# Patient Record
Sex: Female | Born: 1975 | Race: Black or African American | Hispanic: No | Marital: Single | Smoking: Never smoker
Health system: Southern US, Community
[De-identification: ages and names within clinical notes are randomized; demographics above are authoritative.]

---

## 2000-07-21 ENCOUNTER — Emergency Department (HOSPITAL_COMMUNITY): Admission: EM | Admit: 2000-07-21 | Discharge: 2000-07-21 | Payer: Self-pay | Admitting: Emergency Medicine

## 2000-09-16 ENCOUNTER — Emergency Department (HOSPITAL_COMMUNITY): Admission: EM | Admit: 2000-09-16 | Discharge: 2000-09-16 | Payer: Self-pay | Admitting: Emergency Medicine

## 2001-07-24 ENCOUNTER — Emergency Department (HOSPITAL_COMMUNITY): Admission: EM | Admit: 2001-07-24 | Discharge: 2001-07-24 | Payer: Self-pay

## 2002-01-26 ENCOUNTER — Emergency Department (HOSPITAL_COMMUNITY): Admission: EM | Admit: 2002-01-26 | Discharge: 2002-01-26 | Payer: Self-pay | Admitting: Emergency Medicine

## 2002-05-20 ENCOUNTER — Emergency Department (HOSPITAL_COMMUNITY): Admission: EM | Admit: 2002-05-20 | Discharge: 2002-05-20 | Payer: Self-pay | Admitting: Emergency Medicine

## 2003-11-18 ENCOUNTER — Emergency Department (HOSPITAL_COMMUNITY): Admission: EM | Admit: 2003-11-18 | Discharge: 2003-11-18 | Payer: Self-pay | Admitting: Emergency Medicine

## 2004-01-27 ENCOUNTER — Emergency Department (HOSPITAL_COMMUNITY): Admission: EM | Admit: 2004-01-27 | Discharge: 2004-01-27 | Payer: Self-pay | Admitting: Emergency Medicine

## 2004-02-18 ENCOUNTER — Emergency Department (HOSPITAL_COMMUNITY): Admission: EM | Admit: 2004-02-18 | Discharge: 2004-02-18 | Payer: Self-pay | Admitting: Emergency Medicine

## 2004-11-15 ENCOUNTER — Emergency Department (HOSPITAL_COMMUNITY): Admission: EM | Admit: 2004-11-15 | Discharge: 2004-11-15 | Payer: Self-pay | Admitting: Emergency Medicine

## 2004-12-11 ENCOUNTER — Ambulatory Visit (HOSPITAL_COMMUNITY): Admission: RE | Admit: 2004-12-11 | Discharge: 2004-12-11 | Payer: Self-pay | Admitting: *Deleted

## 2005-02-05 ENCOUNTER — Ambulatory Visit (HOSPITAL_COMMUNITY): Admission: RE | Admit: 2005-02-05 | Discharge: 2005-02-05 | Payer: Self-pay | Admitting: *Deleted

## 2005-03-06 ENCOUNTER — Inpatient Hospital Stay (HOSPITAL_COMMUNITY): Admission: AD | Admit: 2005-03-06 | Discharge: 2005-03-06 | Payer: Self-pay | Admitting: *Deleted

## 2005-04-07 ENCOUNTER — Ambulatory Visit (HOSPITAL_COMMUNITY): Admission: RE | Admit: 2005-04-07 | Discharge: 2005-04-07 | Payer: Self-pay | Admitting: *Deleted

## 2005-05-29 ENCOUNTER — Ambulatory Visit (HOSPITAL_COMMUNITY): Admission: RE | Admit: 2005-05-29 | Discharge: 2005-05-29 | Payer: Self-pay | Admitting: Obstetrics

## 2005-07-03 ENCOUNTER — Inpatient Hospital Stay (HOSPITAL_COMMUNITY): Admission: AD | Admit: 2005-07-03 | Discharge: 2005-07-05 | Payer: Self-pay | Admitting: Obstetrics

## 2005-10-22 ENCOUNTER — Emergency Department (HOSPITAL_COMMUNITY): Admission: EM | Admit: 2005-10-22 | Discharge: 2005-10-22 | Payer: Self-pay | Admitting: Emergency Medicine

## 2005-11-11 ENCOUNTER — Encounter: Admission: RE | Admit: 2005-11-11 | Discharge: 2005-11-24 | Payer: Self-pay | Admitting: Specialist

## 2006-01-25 ENCOUNTER — Emergency Department (HOSPITAL_COMMUNITY): Admission: EM | Admit: 2006-01-25 | Discharge: 2006-01-25 | Payer: Self-pay | Admitting: Emergency Medicine

## 2006-04-14 IMAGING — US US OB LIMITED
1 series · 14 of 28 positions shown · non-contrast
Comparison: none

CLINICAL DATA: Low-lying placenta noted on prior exam.  Assess placental location.

[Series 1: us ob limited · 0.29mm/px · 14 of 39 slices shown]
[im 2/39]
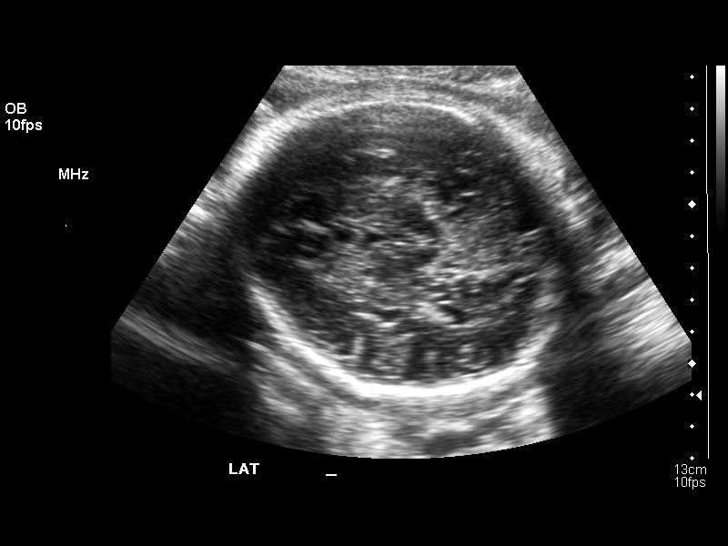
[im 5/39]
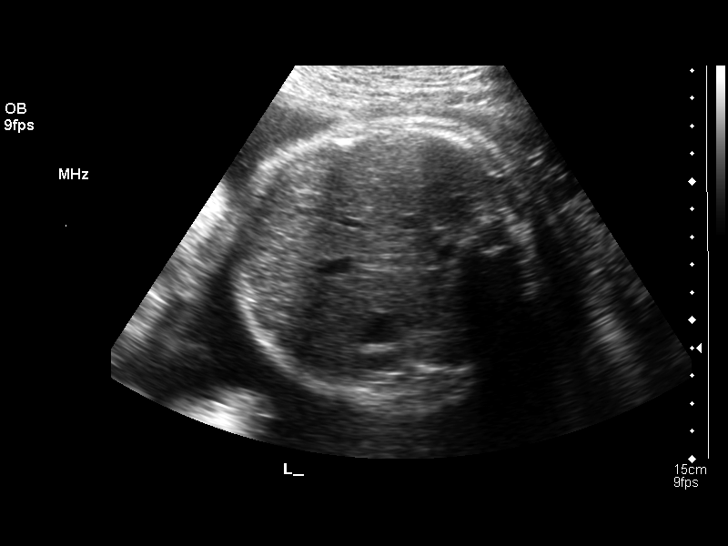
[im 8/39]
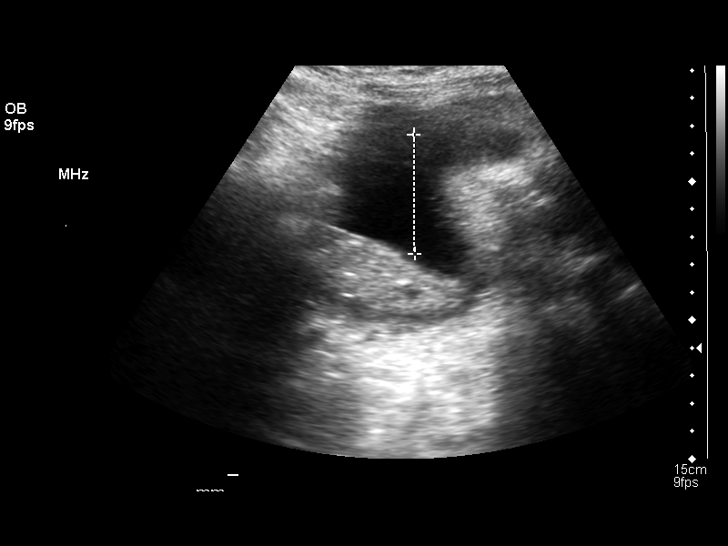
[im 10/39]
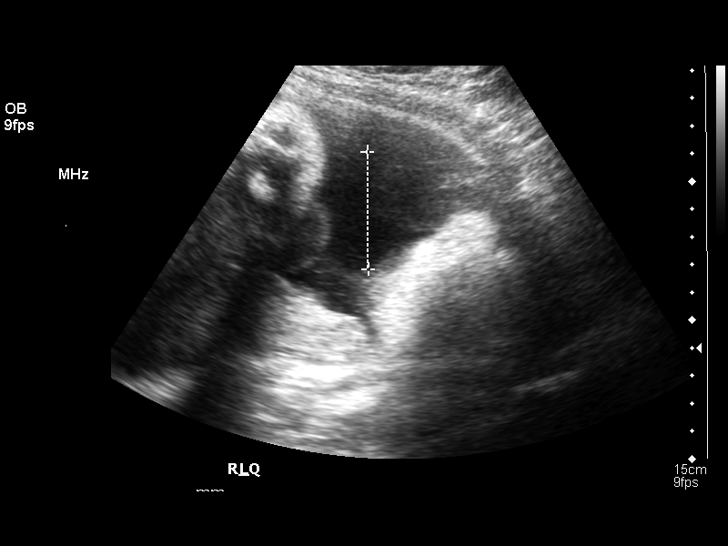
[im 13/39]
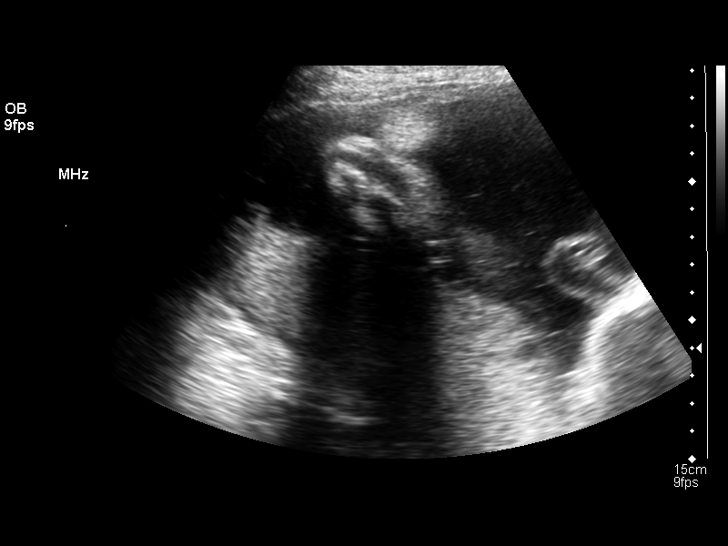
[im 16/39]
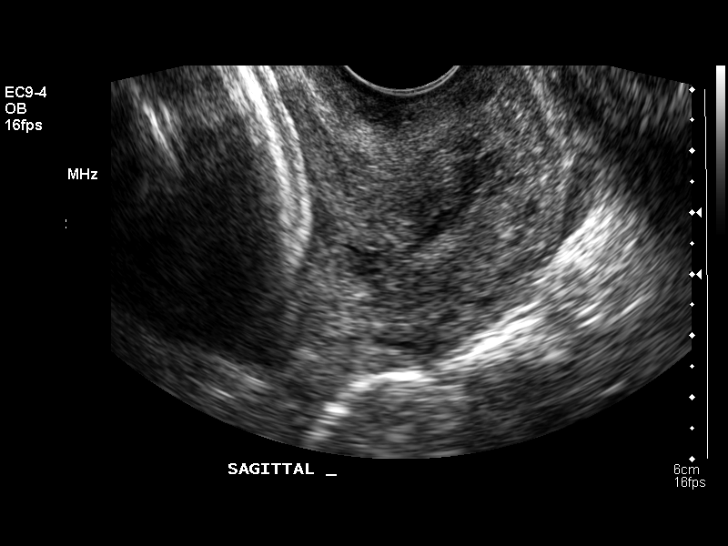
[im 19/39]
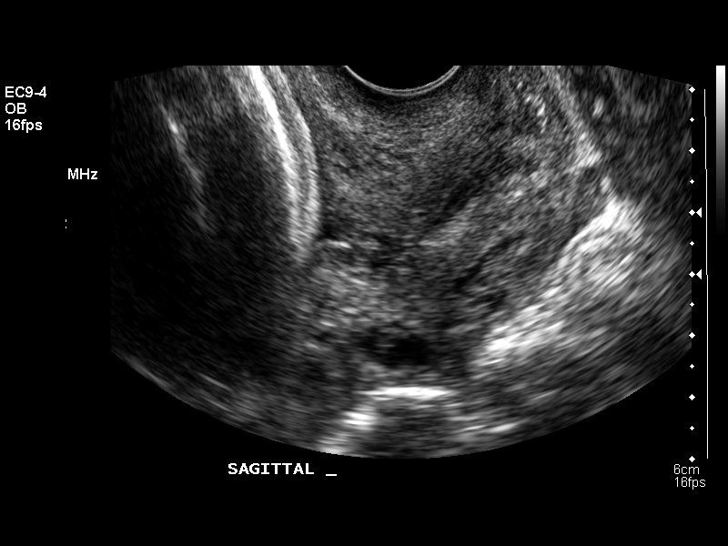
[im 22/39]
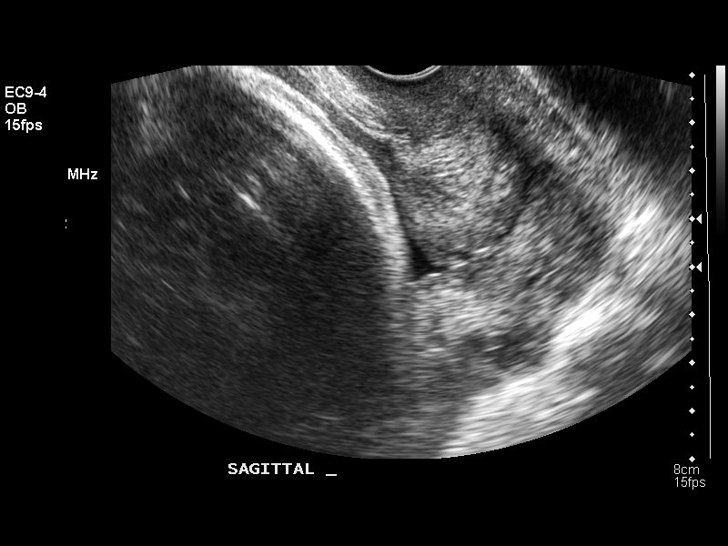
[im 24/39]
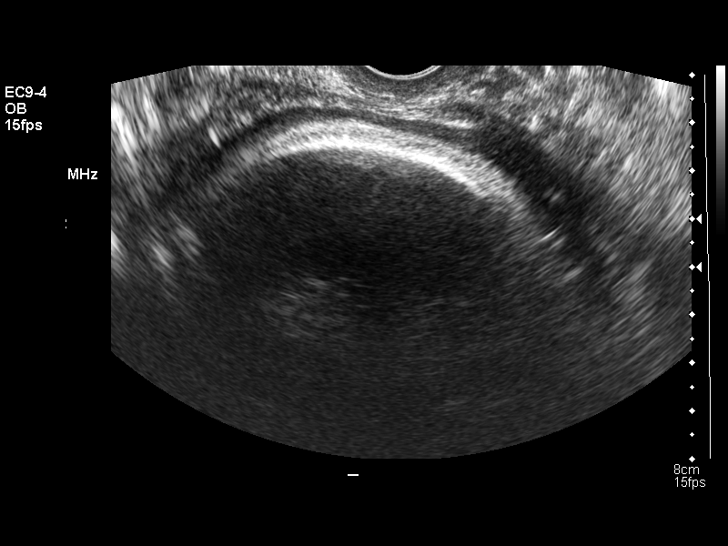
[im 27/39]
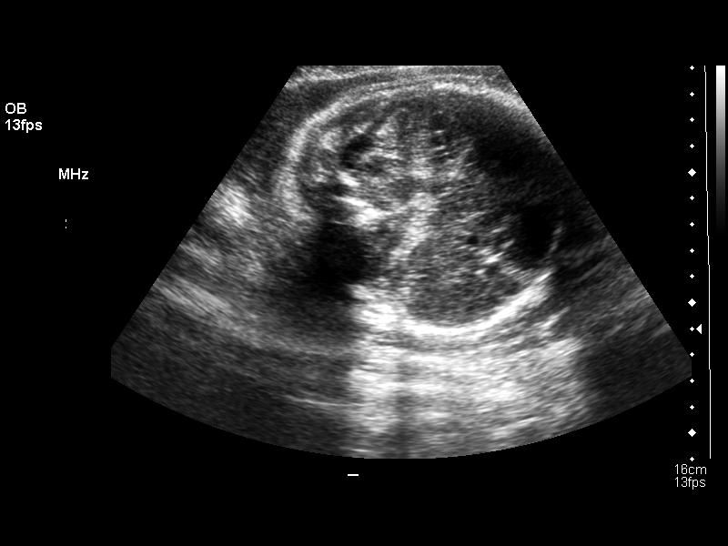
[im 30/39]
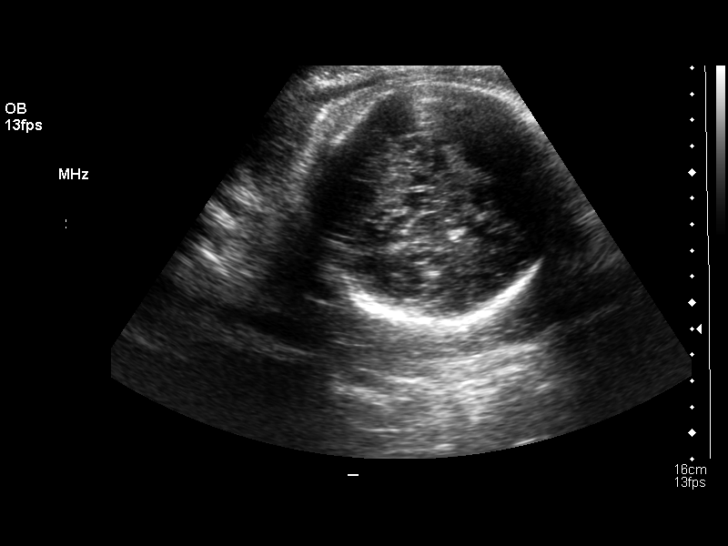
[im 33/39]
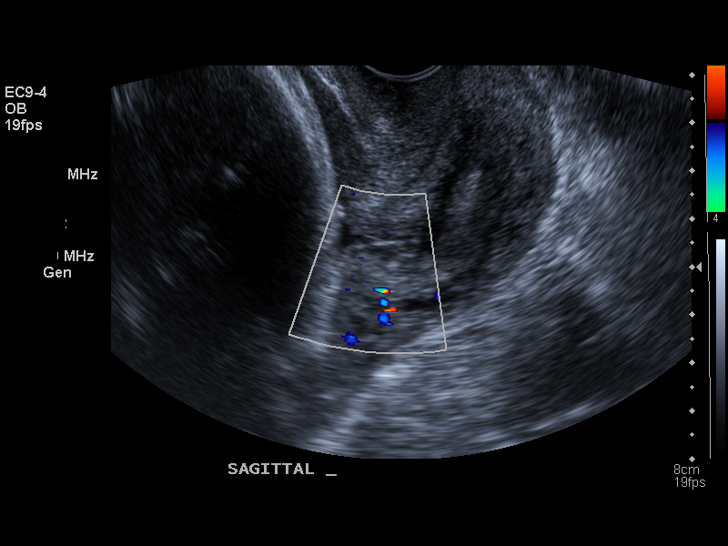
[im 36/39]
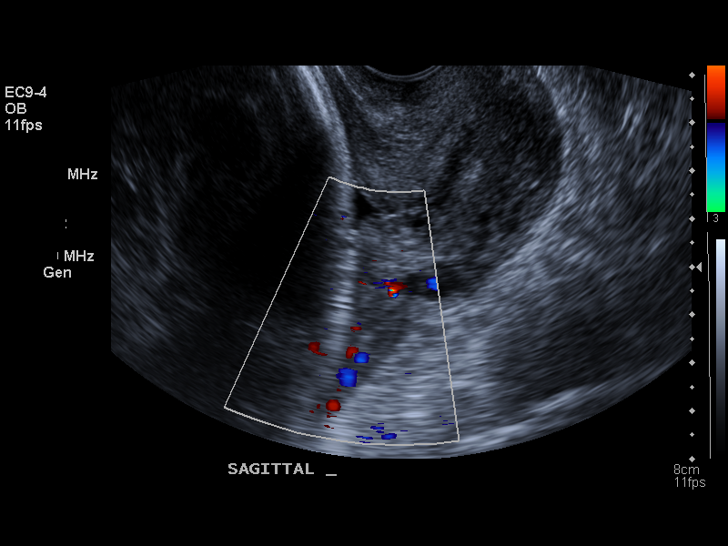
[im 39/39]
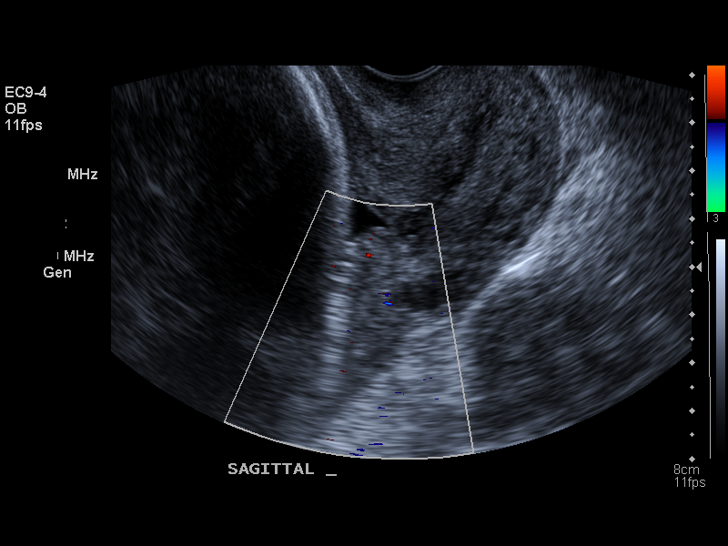

[14 of 28 positions shown; findings below may reference images not displayed]

LIMITED OBSTETRICAL ULTRASOUND WITH TRANSVAGINAL:
Number of Fetuses:  1
Heart Rate:  157
Movement:  Yes
Breathing:  Yes
Presentation:  Cephalic
Placental Location:  Posterior, right lateral 
Grade:  I
Previa:  No
Amniotic Fluid (Subjective):  Normal
Amniotic Fluid (Objective):  15.1 cm AFI (5th -95th%ile = 7.9 -  24.9 cm for 35 wks)

Fetal measurements and complete anatomic evaluation were not requested.  The following fetal anatomy was visualized during this exam:  Lateral ventricles, four chamber heart, stomach, and kidneys.  

MATERNAL UTERINE AND ADNEXAL FINDINGS
Cervix:  3.2 cm Transvaginally
IMPRESSION: 1.  No evidence for residual low-lying placenta.
2.  Subjectively and quantitatively normal amniotic fluid volume and normal cervical length.
3.  No late developing fetal anatomic abnormalities are identified associated with the lateral ventricles, four chamber heart, stomach, kidneys or bladder.

## 2011-12-15 ENCOUNTER — Emergency Department (HOSPITAL_COMMUNITY)
Admission: EM | Admit: 2011-12-15 | Discharge: 2011-12-15 | Disposition: A | Discharge status: Home / Self Care | Payer: Self-pay | Attending: Emergency Medicine | Admitting: Emergency Medicine

## 2011-12-15 ENCOUNTER — Encounter: Payer: Self-pay | Admitting: *Deleted

## 2011-12-15 ENCOUNTER — Emergency Department (HOSPITAL_COMMUNITY): Payer: Self-pay

## 2011-12-15 DIAGNOSIS — R10819 Abdominal tenderness, unspecified site: Secondary | ICD-10-CM | POA: Insufficient documentation

## 2011-12-15 DIAGNOSIS — K5289 Other specified noninfective gastroenteritis and colitis: Secondary | ICD-10-CM | POA: Insufficient documentation

## 2011-12-15 DIAGNOSIS — K529 Noninfective gastroenteritis and colitis, unspecified: Secondary | ICD-10-CM

## 2011-12-15 DIAGNOSIS — R109 Unspecified abdominal pain: Secondary | ICD-10-CM | POA: Insufficient documentation

## 2011-12-15 DIAGNOSIS — R319 Hematuria, unspecified: Secondary | ICD-10-CM | POA: Insufficient documentation

## 2011-12-15 LAB — URINALYSIS, ROUTINE W REFLEX MICROSCOPIC
Bilirubin Urine: NEGATIVE
Glucose, UA: NEGATIVE mg/dL
Ketones, ur: NEGATIVE mg/dL
Leukocytes, UA: NEGATIVE
Nitrite: NEGATIVE
Protein, ur: NEGATIVE mg/dL
Specific Gravity, Urine: 1.01 (ref 1.005–1.030)
Urobilinogen, UA: 0.2 mg/dL (ref 0.0–1.0)

## 2011-12-15 LAB — COMPREHENSIVE METABOLIC PANEL
ALT: 32 U/L (ref 0–35)
Albumin: 4.4 g/dL (ref 3.5–5.2)
Alkaline Phosphatase: 82 U/L (ref 39–117)
BUN: 12 mg/dL (ref 6–23)
CO2: 23 mEq/L (ref 19–32)
Calcium: 10.7 mg/dL — ABNORMAL HIGH (ref 8.4–10.5)
GFR calc Af Amer: >90 mL/min (ref >90)
Glucose, Bld: 167 mg/dL — ABNORMAL HIGH (ref 70–99)
Potassium: 3.4 mEq/L — ABNORMAL LOW (ref 3.5–5.1)
Sodium: 138 mEq/L (ref 135–145)
Total Protein: 8 g/dL (ref 6.0–8.3)

## 2011-12-15 LAB — DIFFERENTIAL
Basophils Absolute: 0 10*3/uL (ref 0.0–0.1)
Eosinophils Absolute: 0 10*3/uL (ref 0.0–0.7)
Eosinophils Relative: 0 % (ref 0–5)
Lymphs Abs: 1 10*3/uL (ref 0.7–4.0)
Monocytes Relative: 5 % (ref 3–12)
Neutro Abs: 14 10*3/uL — ABNORMAL HIGH (ref 1.7–7.7)
Neutrophils Relative %: 89 % — ABNORMAL HIGH (ref 43–77)

## 2011-12-15 LAB — CBC
HCT: 38.2 % (ref 36.0–46.0)
Hemoglobin: 13.7 g/dL (ref 12.0–15.0)
MCH: 31.8 pg (ref 26.0–34.0)
MCHC: 35.9 g/dL (ref 30.0–36.0)
MCV: 88.6 fL (ref 78.0–100.0)
Platelets: 324 10*3/uL (ref 150–400)
RBC: 4.31 MIL/uL (ref 3.87–5.11)
RDW: 13.2 % (ref 11.5–15.5)

## 2011-12-15 LAB — URINE MICROSCOPIC-ADD ON

## 2011-12-15 LAB — LIPASE, BLOOD: Lipase: 21 U/L (ref 11–59)

## 2011-12-15 LAB — PREGNANCY, URINE: Preg Test, Ur: NEGATIVE

## 2011-12-15 MED ORDER — ONDANSETRON HCL 4 MG PO TABS: 4.0000 mg | ORAL_TABLET | Freq: Four times a day (QID) | ORAL | Status: AC

## 2011-12-15 MED ORDER — MORPHINE SULFATE 4 MG/ML IJ SOLN: 6.0000 mg | Freq: Once | INTRAMUSCULAR | Status: AC

## 2011-12-15 MED ORDER — DICYCLOMINE HCL 20 MG PO TABS: 20.0000 mg | ORAL_TABLET | Freq: Two times a day (BID) | ORAL | Status: AC

## 2011-12-15 MED ORDER — SODIUM CHLORIDE 0.9 % IV BOLUS (SEPSIS)
1000.0000 mL | Freq: Once | INTRAVENOUS | Status: AC
  Administered 2011-12-15: 1000 mL via INTRAVENOUS

## 2011-12-15 MED ORDER — MORPHINE SULFATE 2 MG/ML IJ SOLN
INTRAMUSCULAR | Status: AC
  Administered 2011-12-15: 6 mg via INTRAVENOUS
  Filled 2011-12-15: qty 3

## 2011-12-15 MED ORDER — ONDANSETRON HCL 4 MG/2ML IJ SOLN
4.0000 mg | Freq: Once | INTRAMUSCULAR | Status: AC
  Administered 2011-12-15: 4 mg via INTRAVENOUS
  Filled 2011-12-15: qty 2

## 2011-12-15 NOTE — ED Notes (Signed)
Transported to CT 

## 2011-12-15 NOTE — ED Notes (Signed)
Returned from CT.

## 2011-12-15 NOTE — ED Provider Notes (Signed)
History     CSN: [id number] Arrival date & time: 12/15/2011 11:43 AM   First MD Initiated Contact with Patient 12/15/11 1224      Chief Complaint  Patient presents with  . Abdominal Pain    pt reports abd pain that began last night. pt began to projectile vomiting while in triage.     (Consider location/radiation/quality/duration/timing/severity/associated sxs/prior treatment) Patient is a 35 y.o. female presenting with abdominal pain. The history is provided by the patient.  Abdominal Pain The primary symptoms of the illness include abdominal pain, nausea, vomiting and diarrhea. The primary symptoms of the illness do not include fever, fatigue or shortness of breath. The current episode started yesterday. The onset of the illness was gradual. The problem has been gradually worsening.  The patient states that she believes she is currently not pregnant. The patient has had a change in bowel habit (diarrhea). Symptoms associated with the illness do not include chills, diaphoresis, urgency, hematuria, frequency or back pain. Significant associated medical issues do not include gallstones or diverticulitis.    History reviewed. No pertinent past medical history.  History reviewed. No pertinent past surgical history.  History reviewed. No pertinent family history.  History  Substance Use Topics  . Smoking status: Never Smoker   . Smokeless tobacco: Not on file  . Alcohol Use: No    OB History    Grav Para Term Preterm Abortions TAB SAB Ect Mult Living                  Review of Systems  Constitutional: Negative for fever, chills, diaphoresis and fatigue.  HENT: Negative for congestion, rhinorrhea and sneezing.   Eyes: Negative.   Respiratory: Negative for cough, chest tightness and shortness of breath.   Cardiovascular: Negative for chest pain and leg swelling.  Gastrointestinal: Positive for nausea, vomiting, abdominal pain and diarrhea. Negative for blood in stool.    Genitourinary: Negative for urgency, frequency, hematuria, flank pain and difficulty urinating.  Musculoskeletal: Negative for back pain and arthralgias.  Skin: Negative for rash.  Neurological: Negative for dizziness, speech difficulty, weakness, numbness and headaches.    Allergies  Review of patient's allergies indicates no known allergies.  Home Medications   Current Outpatient Rx  Name Route Sig Dispense Refill  . DICYCLOMINE HCL 20 MG PO TABS Oral Take 1 tablet (20 mg total) by mouth 2 (two) times daily. 20 tablet 0  . ONDANSETRON HCL 4 MG PO TABS Oral Take 1 tablet (4 mg total) by mouth every 6 (six) hours. 12 tablet 0    BP 146/117  Pulse 83  Temp(Src) 97.1 F (36.2 C) (Oral)  Resp 20  Ht 5\' 6"  (1.676 m)  Wt 200 lb (90.719 kg)  BMI 32.28 kg/m2  SpO2 100%  LMP 12/07/2011  Physical Exam  Constitutional: She is oriented to person, place, and time. She appears well-developed and well-nourished.  HENT:  Head: Normocephalic and atraumatic.  Eyes: Pupils are equal, round, and reactive to light.  Neck: Normal range of motion. Neck supple.  Cardiovascular: Normal rate, regular rhythm and normal heart sounds.   Pulmonary/Chest: Effort normal and breath sounds normal. No respiratory distress. She has no wheezes. She has no rales. She exhibits no tenderness.  Abdominal: Soft. Bowel sounds are normal. There is tenderness. There is no rebound and no guarding.       Moderate diffuse tenderness  Musculoskeletal: Normal range of motion. She exhibits no edema.  Lymphadenopathy:    She has no  cervical adenopathy.  Neurological: She is alert and oriented to person, place, and time.  Skin: Skin is warm and dry. No rash noted.  Psychiatric: She has a normal mood and affect.    ED Course  Procedures (including critical care time)  Results for orders placed during the hospital encounter of 12/15/11  CBC      Component Value Range   WBC 15.7 (*) 4.0 - 10.5 (K/uL)   RBC 4.31   3.87 - 5.11 (MIL/uL)   Hemoglobin 13.7  12.0 - 15.0 (g/dL)   HCT 78.2  95.6 - 21.3 (%)   MCV 88.6  78.0 - 100.0 (fL)   MCH 31.8  26.0 - 34.0 (pg)   MCHC 35.9  30.0 - 36.0 (g/dL)   RDW 08.6  57.8 - 46.9 (%)   Platelets 324  150 - 400 (K/uL)  DIFFERENTIAL      Component Value Range   Neutrophils Relative 89 (*) 43 - 77 (%)   Neutro Abs 14.0 (*) 1.7 - 7.7 (K/uL)   Lymphocytes Relative 6 (*) 12 - 46 (%)   Lymphs Abs 1.0  0.7 - 4.0 (K/uL)   Monocytes Relative 5  3 - 12 (%)   Monocytes Absolute 0.8  0.1 - 1.0 (K/uL)   Eosinophils Relative 0  0 - 5 (%)   Eosinophils Absolute 0.0  0.0 - 0.7 (K/uL)   Basophils Relative 0  0 - 1 (%)   Basophils Absolute 0.0  0.0 - 0.1 (K/uL)  COMPREHENSIVE METABOLIC PANEL      Component Value Range   Sodium 138  135 - 145 (mEq/L)   Potassium 3.4 (*) 3.5 - 5.1 (mEq/L)   Chloride 100  96 - 112 (mEq/L)   CO2 23  19 - 32 (mEq/L)   Glucose, Bld 167 (*) 70 - 99 (mg/dL)   BUN 12  6 - 23 (mg/dL)   Creatinine, Ser 6.29  0.50 - 1.10 (mg/dL)   Calcium 52.8 (*) 8.4 - 10.5 (mg/dL)   Total Protein 8.0  6.0 - 8.3 (g/dL)   Albumin 4.4  3.5 - 5.2 (g/dL)   AST 39 (*) 0 - 37 (U/L)   ALT 32  0 - 35 (U/L)   Alkaline Phosphatase 82  39 - 117 (U/L)   Total Bilirubin 0.8  0.3 - 1.2 (mg/dL)   GFR calc non Af Amer 79 (*) >90 (mL/min)   GFR calc Af Amer >90  >90 (mL/min)  LIPASE, BLOOD      Component Value Range   Lipase 21  11 - 59 (U/L)  URINALYSIS, ROUTINE W REFLEX MICROSCOPIC      Component Value Range   Color, Urine YELLOW  YELLOW    APPearance CLEAR  CLEAR    Specific Gravity, Urine 1.010  1.005 - 1.030    pH 8.5 (*) 5.0 - 8.0    Glucose, UA NEGATIVE  NEGATIVE (mg/dL)   Hgb urine dipstick MODERATE (*) NEGATIVE    Bilirubin Urine NEGATIVE  NEGATIVE    Ketones, ur NEGATIVE  NEGATIVE (mg/dL)   Protein, ur NEGATIVE  NEGATIVE (mg/dL)   Urobilinogen, UA 0.2  0.0 - 1.0 (mg/dL)   Nitrite NEGATIVE  NEGATIVE    Leukocytes, UA NEGATIVE  NEGATIVE   PREGNANCY, URINE       Component Value Range   Preg Test, Ur NEGATIVE    URINE MICROSCOPIC-ADD ON      Component Value Range   RBC / HPF 7-10  <3 (RBC/hpf)   Bacteria,  UA MANY (*) RARE    Ct Abdomen Pelvis Wo Contrast  12/15/2011  *RADIOLOGY REPORT*  Clinical Data: Abdominal pain, left flank pain.  Hematuria.  CT ABDOMEN AND PELVIS WITHOUT CONTRAST  Technique:  Multidetector CT imaging of the abdomen and pelvis was performed following the standard protocol without intravenous contrast.  Comparison: None.  Findings: Lung bases are clear.  No effusions.  Heart is normal size.  No renal or ureteral stones.  No hydronephrosis.  Calcifications in the pelvis compatible with phleboliths.  Uterus, adnexa urinary bladder are unremarkable.  Appendix is visualized and is normal.  The stomach, large and small bowel are normal.  No free fluid, free air or adenopathy.  Aorta is normal caliber.  Liver, gallbladder, spleen, pancreas, adrenals and kidneys have an unremarkable unenhanced appearance.  No acute bony abnormality.  IMPRESSION: No acute findings.  No renal or ureteral stones.  No hydronephrosis.  Original Report Authenticated By: Cyndie Chime, M.D.     No results found.    1. Gastroenteritis   2. Hematuria       MDM  Pt with no pain on re-exam after one dose of morphine.  No vomiting.  Sitting up, talking on phone.  Feel that with vomiting/diarrhea, symptoms are most consistent with gastroenteritis.  No pain over gallbladder.  Appendix normal, no evidence of diverticulitis.  Advised clear liquid diet next 24 hours, f/u with her PMD to have urine rechecked to make sure that blood clears.  Return her for any worsening pain/vomiting/fevers        Rolan Bucco, MD 12/15/11 [phone]

## 2012-10-30 IMAGING — CT CT ABD-PELV W/O CM
1 of 2 series · 16 of 32 positions shown, 20 images · non-contrast
Comparison: None.

CLINICAL DATA: Abdominal pain, left flank pain.  Hematuria.

CT ABDOMEN AND PELVIS WITHOUT CONTRAST
TECHNIQUE: Multidetector CT imaging of the abdomen and pelvis was
performed following the standard protocol without intravenous
contrast.

[Series 2: abd/pel w/o · axial · non-contrast · 0.76mm/px · z∈[-478,-58]mm · 16 of 92 slices shown, 20 images]
[im 4/92  soft-tissue]
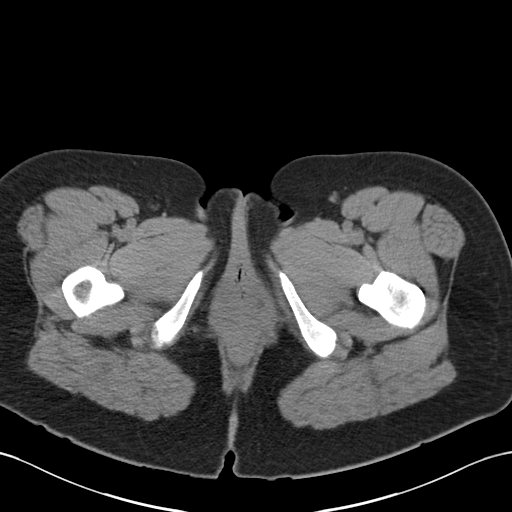
[im 4/92  bone]
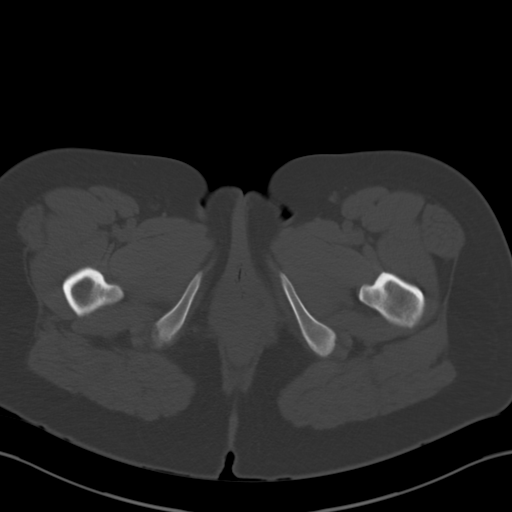
[im 11/92  soft-tissue]
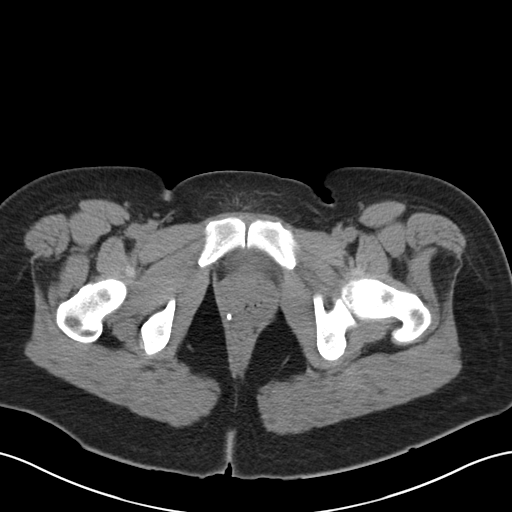
[im 19/92  soft-tissue]
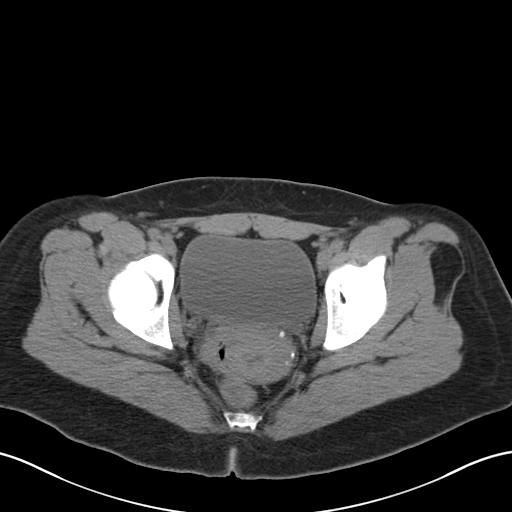
[im 26/92  soft-tissue]
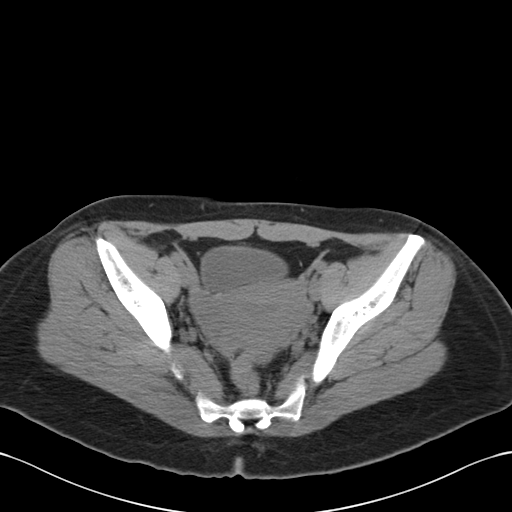
[im 30/92  soft-tissue]
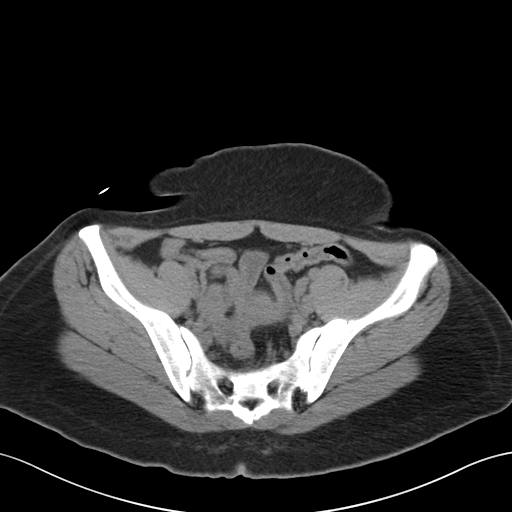
[im 37/92  soft-tissue]
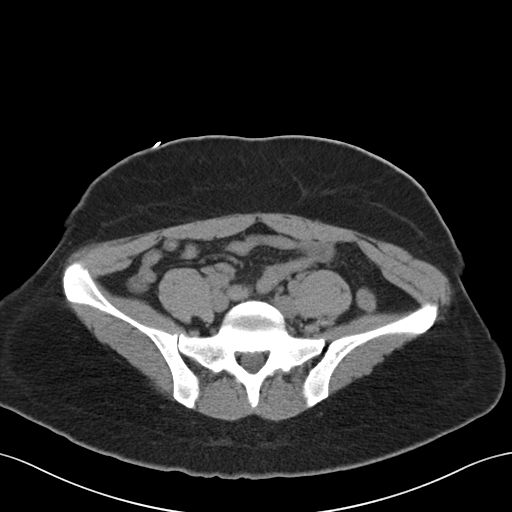
[im 44/92  soft-tissue]
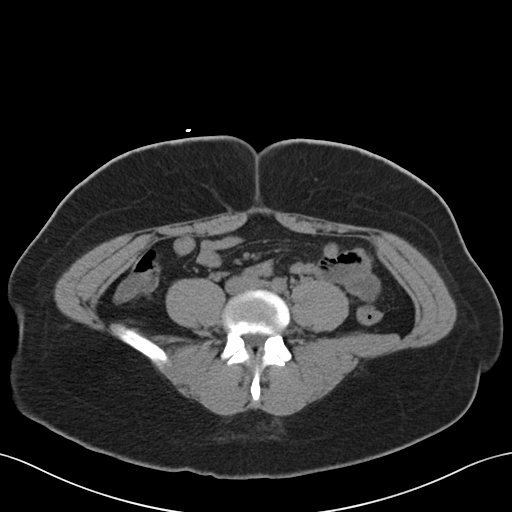
[im 48/92  soft-tissue]
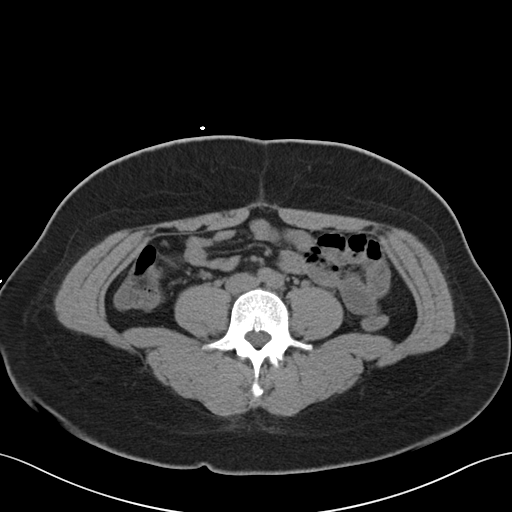
[im 55/92  soft-tissue]
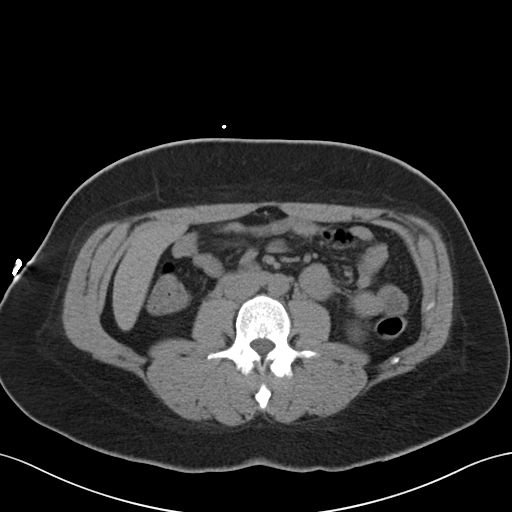
[im 55/92  bone]
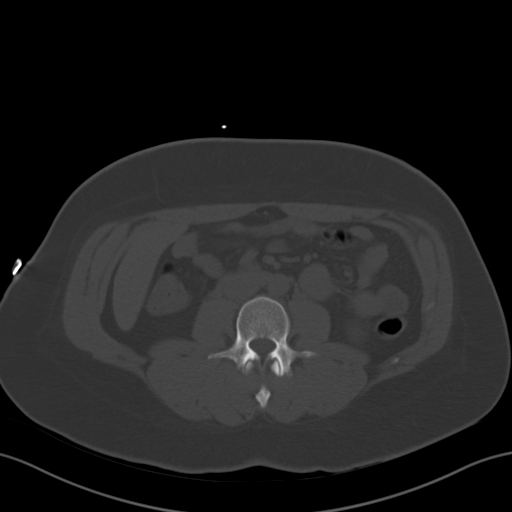
[im 62/92  soft-tissue]
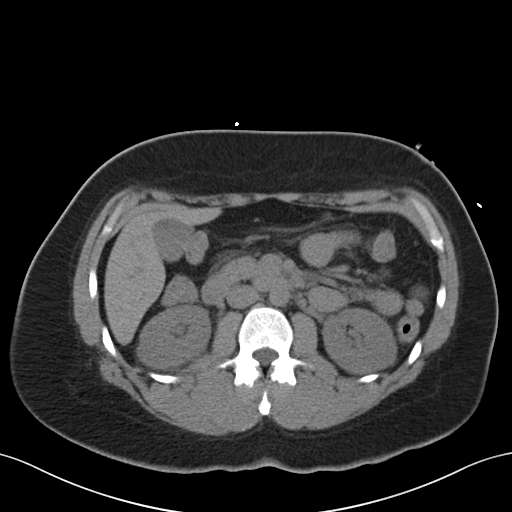
[im 70/92  soft-tissue]
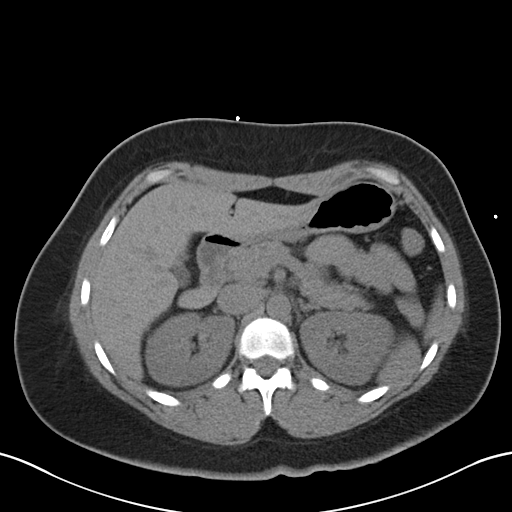
[im 73/92  soft-tissue]
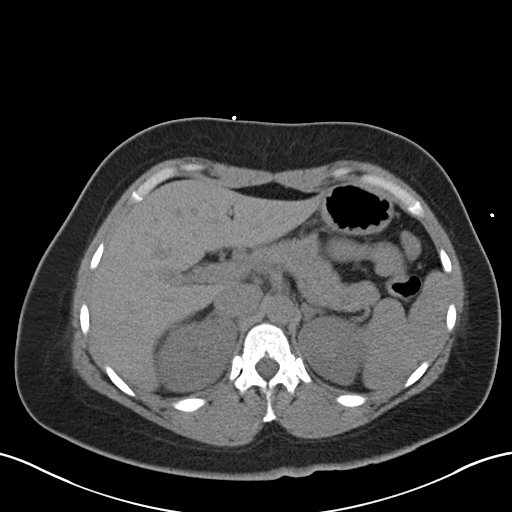
[im 77/92  lung]
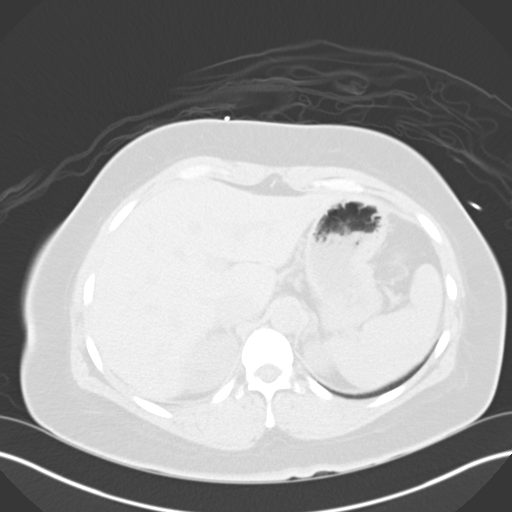
[im 81/92  soft-tissue]
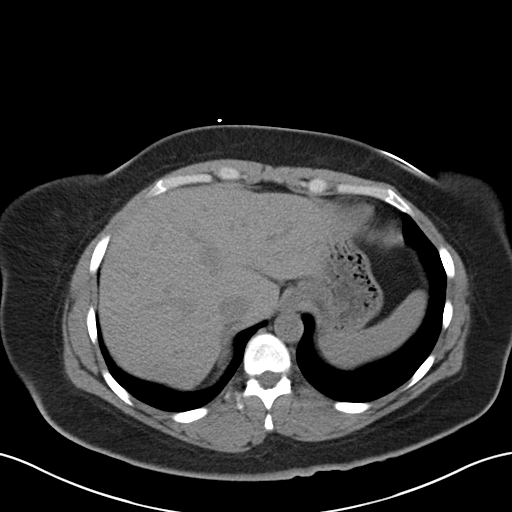
[im 81/92  lung]
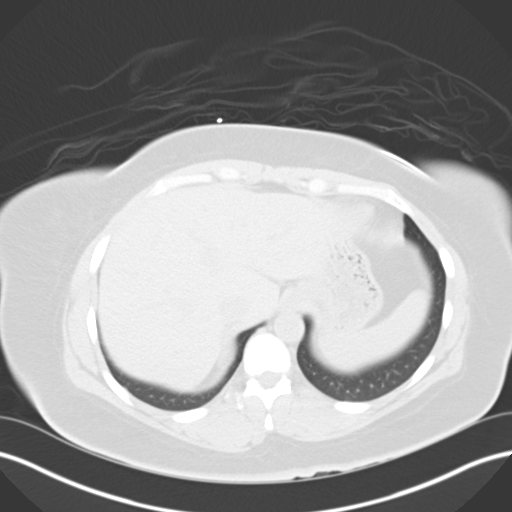
[im 84/92  lung]
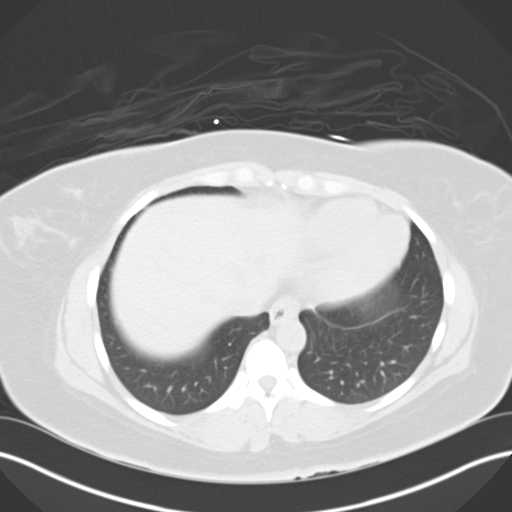
[im 88/92  soft-tissue]
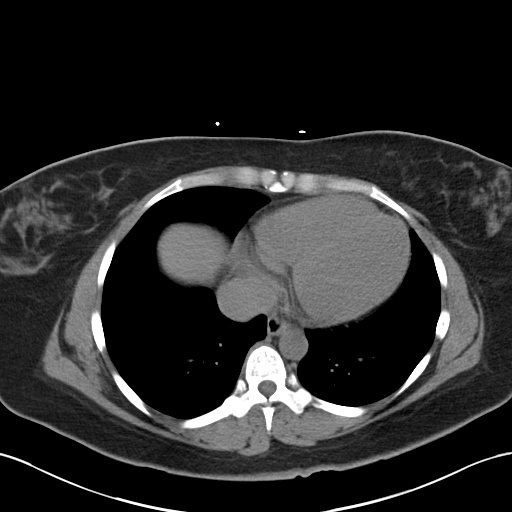
[im 88/92  lung]
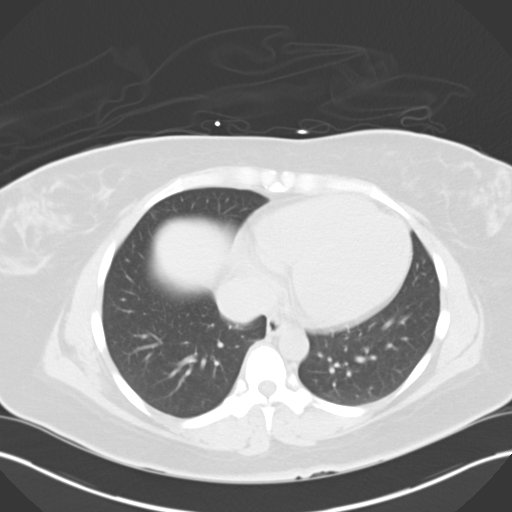

[16 of 32 positions shown; findings below may reference images not displayed]

FINDINGS: Lung bases are clear.  No effusions.  Heart is normal
size.

No renal or ureteral stones.  No hydronephrosis.  Calcifications in
the pelvis compatible with phleboliths.  Uterus, adnexa urinary
bladder are unremarkable.

Appendix is visualized and is normal.  The stomach, large and small
bowel are normal.  No free fluid, free air or adenopathy.  Aorta is
normal caliber.

Liver, gallbladder, spleen, pancreas, adrenals and kidneys have an
unremarkable unenhanced appearance.

No acute bony abnormality.
IMPRESSION: No acute findings.  No renal or ureteral stones.  No
hydronephrosis.

## 2018-04-17 ENCOUNTER — Emergency Department (HOSPITAL_COMMUNITY)
Admission: EM | Admit: 2018-04-17 | Discharge: 2018-04-18 | Disposition: A | Discharge status: Home / Self Care | Payer: Self-pay | Attending: Emergency Medicine | Admitting: Emergency Medicine

## 2018-04-17 ENCOUNTER — Encounter (HOSPITAL_COMMUNITY): Payer: Self-pay

## 2018-04-17 DIAGNOSIS — R112 Nausea with vomiting, unspecified: Secondary | ICD-10-CM | POA: Insufficient documentation

## 2018-04-17 DIAGNOSIS — R197 Diarrhea, unspecified: Secondary | ICD-10-CM | POA: Insufficient documentation

## 2018-04-17 MED ORDER — ONDANSETRON 4 MG PO TBDP: 4.0000 mg | ORAL_TABLET | Freq: Once | ORAL | Status: DC | PRN

## 2018-04-17 NOTE — ED Triage Notes (Signed)
Pt arrived from home due to LLQ abdominal pain that started yesterday evening, pt began vomiting roughly around 5pm. Pt reports one episode of diarrhea.

## 2018-04-18 LAB — COMPREHENSIVE METABOLIC PANEL
ALT: 18 U/L (ref 14–54)
ANION GAP: 13 (ref 5–15)
AST: 25 U/L (ref 15–41)
Albumin: 4.4 g/dL (ref 3.5–5.0)
Alkaline Phosphatase: 75 U/L (ref 38–126)
BUN: 10 mg/dL (ref 6–20)
CO2: 22 mmol/L (ref 22–32)
Calcium: 9.3 mg/dL (ref 8.9–10.3)
Chloride: 102 mmol/L (ref 101–111)
Creatinine, Ser: 0.68 mg/dL (ref 0.44–1.00)
Glucose, Bld: 167 mg/dL — ABNORMAL HIGH (ref 65–99)
POTASSIUM: 3.2 mmol/L — AB (ref 3.5–5.1)
Sodium: 137 mmol/L (ref 135–145)
TOTAL PROTEIN: 8.3 g/dL — AB (ref 6.5–8.1)
Total Bilirubin: 1.2 mg/dL (ref 0.3–1.2)

## 2018-04-18 LAB — URINALYSIS, ROUTINE W REFLEX MICROSCOPIC
BILIRUBIN URINE: NEGATIVE
Bacteria, UA: NONE SEEN
Glucose, UA: 50 mg/dL — AB
Ketones, ur: 20 mg/dL — AB
NITRITE: NEGATIVE
PH: 8 (ref 5.0–8.0)
Protein, ur: 100 mg/dL — AB
SPECIFIC GRAVITY, URINE: 1.013 (ref 1.005–1.030)

## 2018-04-18 LAB — CBC
HEMATOCRIT: 38.4 % (ref 36.0–46.0)
Hemoglobin: 13.7 g/dL (ref 12.0–15.0)
MCH: 32 pg (ref 26.0–34.0)
MCHC: 35.7 g/dL (ref 30.0–36.0)
MCV: 89.7 fL (ref 78.0–100.0)
Platelets: 322 10*3/uL (ref 150–400)
RBC: 4.28 MIL/uL (ref 3.87–5.11)
RDW: 13.6 % (ref 11.5–15.5)
WBC: 12.9 10*3/uL — AB (ref 4.0–10.5)

## 2018-04-18 LAB — LIPASE, BLOOD: LIPASE: 26 U/L (ref 11–51)

## 2018-04-18 LAB — I-STAT BETA HCG BLOOD, ED (MC, WL, AP ONLY): I-stat hCG, quantitative: <5 m[IU]/mL (ref <5)

## 2018-04-18 MED ORDER — ONDANSETRON HCL 4 MG/2ML IJ SOLN
4.0000 mg | Freq: Once | INTRAMUSCULAR | Status: AC
  Administered 2018-04-18: 4 mg via INTRAVENOUS
  Filled 2018-04-18: qty 2

## 2018-04-18 MED ORDER — SODIUM CHLORIDE 0.9 % IV BOLUS
1000.0000 mL | Freq: Once | INTRAVENOUS | Status: AC
  Administered 2018-04-18: 1000 mL via INTRAVENOUS

## 2018-04-18 MED ORDER — ONDANSETRON 4 MG PO TBDP: 4.0000 mg | ORAL_TABLET | Freq: Three times a day (TID) | ORAL | 0 refills | Status: AC | PRN

## 2018-04-18 MED ORDER — HYDROMORPHONE HCL 1 MG/ML IJ SOLN
1.0000 mg | Freq: Once | INTRAMUSCULAR | Status: AC
  Administered 2018-04-18: 1 mg via INTRAVENOUS
  Filled 2018-04-18: qty 1

## 2018-04-18 MED ORDER — MORPHINE SULFATE (PF) 4 MG/ML IV SOLN
4.0000 mg | Freq: Once | INTRAVENOUS | Status: AC
  Administered 2018-04-18: 4 mg via INTRAVENOUS
  Filled 2018-04-18: qty 1

## 2018-04-18 NOTE — ED Notes (Signed)
Unable to give pt PO zofran d/t emesis.

## 2018-04-18 NOTE — ED Provider Notes (Signed)
[facility]-EMERGENCY DEPT Provider Note   CSN: [id number] Arrival date & time: 04/17/18  2241     History   Chief Complaint Chief Complaint  Patient presents with  . Emesis    HPI Heidi Stuart is a 42 y.o. female.  Patient presents to the emergency department with a chief complaint of abdominal pain.  She reports associated nausea, vomiting, diarrhea.  She states the symptoms started yesterday afternoon.  She denies any sick contacts.  Denies any fevers or chills.  Denies any dysuria.  Denies any vaginal complaints.  She has not taken anything for symptoms.  Pain does not radiate.  The history is provided by the patient. No language interpreter was used.    History reviewed. No pertinent past medical history.  There are no active problems to display for this patient.   History reviewed. No pertinent surgical history.   OB History   None      Home Medications    Prior to Admission medications   Medication Sig Start Date End Date Taking? Authorizing Provider  dicyclomine (BENTYL) 20 MG tablet Take 1 tablet (20 mg total) by mouth 2 (two) times daily. 12/15/11 12/14/12  Rolan Bucco, MD    Family History History reviewed. No pertinent family history.  Social History Social History   Tobacco Use  . Smoking status: Never Smoker  . Smokeless tobacco: Never Used  Substance Use Topics  . Alcohol use: No  . Drug use: No     Allergies   Patient has no known allergies.   Review of Systems Review of Systems  All other systems reviewed and are negative.    Physical Exam Updated Vital Signs BP (!) 183/105   Pulse 76   Temp 98.5 F (36.9 C) (Oral)   Resp 16   Ht 5\' 6"  (1.676 m)   Wt 86.2 kg (190 lb)   SpO2 100%   BMI 30.67 kg/m   Physical Exam  Constitutional: She is oriented to person, place, and time. She appears well-developed and well-nourished.  HENT:  Head: Normocephalic and atraumatic.  Eyes: Pupils are equal, round,  and reactive to light. Conjunctivae and EOM are normal.  Neck: Normal range of motion. Neck supple.  Cardiovascular: Normal rate and regular rhythm. Exam reveals no gallop and no friction rub.  No murmur heard. Pulmonary/Chest: Effort normal and breath sounds normal. No respiratory distress. She has no wheezes. She has no rales. She exhibits no tenderness.  Abdominal: Soft. Bowel sounds are normal. She exhibits no distension and no mass. There is no tenderness. There is no rebound and no guarding.  No focal abdominal tenderness, no RLQ tenderness or pain at McBurney's point, no RUQ tenderness or Murphy's sign, no left-sided abdominal tenderness, no fluid wave, or signs of peritonitis   Musculoskeletal: Normal range of motion. She exhibits no edema or tenderness.  Neurological: She is alert and oriented to person, place, and time.  Skin: Skin is warm and dry.  Psychiatric: She has a normal mood and affect. Her behavior is normal. Judgment and thought content normal.  Nursing note and vitals reviewed.    ED Treatments / Results  Labs (all labs ordered are listed, but only abnormal results are displayed) Labs Reviewed  COMPREHENSIVE METABOLIC PANEL - Abnormal; Notable for the following components:      Result Value   Potassium 3.2 (*)    Glucose, Bld 167 (*)    Total Protein 8.3 (*)    All other components  within normal limits  CBC - Abnormal; Notable for the following components:   WBC 12.9 (*)    All other components within normal limits  LIPASE, BLOOD  URINALYSIS, ROUTINE W REFLEX MICROSCOPIC  I-STAT BETA HCG BLOOD, ED ([facility], [facility], [facility] ONLY)    EKG None  Radiology No results found.  Procedures Procedures (including critical care time)  Medications Ordered in ED Medications  sodium chloride 0.9 % bolus 1,000 mL (1,000 mLs Intravenous New Bag/Given 04/18/18 0152)  ondansetron (ZOFRAN) injection 4 mg (4 mg Intravenous Given 04/18/18 0103)  morphine 4 MG/ML injection 4 mg (4 mg  Intravenous Given 04/18/18 0153)  ondansetron (ZOFRAN) injection 4 mg (4 mg Intravenous Given 04/18/18 0153)     Initial Impression / Assessment and Plan / ED Course  I have reviewed the triage vital signs and the nursing notes.  Pertinent labs & imaging results that were available during my care of the patient were reviewed by me and considered in my medical decision making (see chart for details).     Patient with generalized abdominal discomfort, nausea, vomiting, diarrhea.  Onset was yesterday.  Vital signs are stable.  Patient has no focal abdominal tenderness, but does have generalized discomfort.  Likely secondary to vomiting.  Will give fluids, pain medicine, and Zofran.  Doubt surgical or acute abdomen.  At this time, believe symptoms to be secondary to gastroenteritis.  4:40 AM Patient reassessed.  She is feeling significantly improved.  Will discharge to home with return precautions.  Patient understands and agrees with plan.  Final Clinical Impressions(s) / ED Diagnoses   Final diagnoses:  Nausea vomiting and diarrhea    ED Discharge Orders        Ordered    ondansetron (ZOFRAN ODT) 4 MG disintegrating tablet  Every 8 hours PRN     04/18/18 0439       Roxy HorsemanBrowning, Smt Lokey, PA-C 04/18/18 0440    Ward, Layla MawKristen N, DO 04/18/18 [phone]
